# Patient Record
Sex: Female | Born: 1995 | Race: White | Hispanic: No | Marital: Single
Health system: Southern US, Community
[De-identification: ages and names within clinical notes are randomized; demographics above are authoritative.]

---
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
## 2015-09-07 ENCOUNTER — Ambulatory Visit: Payer: Self-pay

## 2015-09-10 ENCOUNTER — Encounter: Payer: Self-pay | Admitting: Rehabilitative and Restorative Service Providers"

## 2015-09-10 ENCOUNTER — Ambulatory Visit: Payer: Medicaid Other | Attending: Family Medicine | Admitting: Rehabilitative and Restorative Service Providers"

## 2015-09-10 DIAGNOSIS — M545 Low back pain, unspecified: Secondary | ICD-10-CM

## 2015-09-10 DIAGNOSIS — R293 Abnormal posture: Secondary | ICD-10-CM | POA: Insufficient documentation

## 2015-09-10 DIAGNOSIS — M546 Pain in thoracic spine: Secondary | ICD-10-CM | POA: Insufficient documentation

## 2015-09-10 NOTE — Patient Instructions (Addendum)
HEP issued: prayer stretch in neutral and diagonals 2x/day 2 reps 30 sec holds; cat/camel x 10 with 5 sec holds 2x/day. Reviewed POC with pt/Mom. No pain with tx. Advised pt/mom that pt may be sore after this visit and she is to perform HEP/heat as indicated for further pain reduction. No pain after tx.

## 2015-09-10 NOTE — Therapy (Signed)
[facility] Outpatient Rehabilitation [facility] [address] [place], [place], [address] Phone: [phone]   Fax:  [phone]  Physical Therapy Evaluation  Patient Details  Name: Claudia Walters MRN: [id number] Date of Birth: 1995/03/21 Referring Provider: Dr. Maurice Small  Encounter Date: 09/10/2015    History reviewed. No pertinent past medical history.  History reviewed. No pertinent surgical history.  There were no vitals filed for this visit.       Subjective Assessment - 09/10/15 1030    Subjective 5-6/10 pain on average; however, pain is intermittent in nature; pt denies radiation.   Pertinent History LBP began last May helping school setup a table when back seized up; she has had intermittent pain since. no diagnostic testing performed   Limitations Standing;Lifting   How long can you sit comfortably? 1.5 hours   How long can you stand comfortably? 45 min   How long can you walk comfortably? 45 min   Diagnostic tests none   Patient Stated Goals to be painfree   Currently in Pain? Yes   Pain Score 8    Pain Location Back   Pain Descriptors / Indicators Cramping   Pain Frequency Intermittent   Aggravating Factors  reaching, lifting, walking, standing   Pain Relieving Factors medicine (Ibuprofen), heat   Effect of Pain on Daily Activities minimal to moderate depending on pain level   Multiple Pain Sites No            [facility] PT Assessment - 09/10/15 0001      Assessment   Medical Diagnosis LBP   Referring Provider Dr. Maurice Small   Onset Date/Surgical Date --  May 2017   Hand Dominance --  right   Next MD Visit --  unknown   Prior Therapy none     Precautions   Precautions None     Restrictions   Weight Bearing Restrictions --  none     Balance Screen   Has the patient fallen in the past 6 months --  no     Home Environment   Living Environment --  lives with mom, goes to college, carries bookbag both shldrs     Prior  Function   Level of Independence --  independent     Cognition   Overall Cognitive Status Within Functional Limits for tasks assessed     Posture/Postural Control   Posture Comments wide shoulders, sacral sitting, slumped, rounded shoulders     ROM / Strength   AROM / PROM / Strength --  thoracolumbar AROM WNL but with bil thoracic psp discomfort      Palpation   Spinal mobility thoracic discomfort with bil shoulder ext   Palpation comment T2-4, T7, L1 hypomobile, L1 pivot point,  bil paraspinals tight and tender L > R                   [facility] Adult PT Treatment/Exercise - 09/10/15 0001      Exercises   Exercises Lumbar     Lumbar Exercises: Quadruped   Other Quadruped Lumbar Exercises prayer stretch x 30 sec each direction x 1 set; cat/camel x 5 with 3-5 sec holds; HEP issued      Manual Therapy   Manual therapy comments T4-7, L1 spinal mobs grade 3 to 2 for pain management and hypomobility                PT Education - 09/10/15 1103    Education provided Yes   Education  Details see pt instructions   Person(s) Educated Patient   Methods Explanation;Demonstration;Handout   Comprehension Returned demonstration;Verbalized understanding          PT Short Term Goals - 09/10/15 1106      PT SHORT TERM GOAL #1   Title pt wil lbe I with HEP   Baseline not I; issued today   Time 3   Period Weeks   Status New     PT SHORT TERM GOAL #2   Title Pt will report 25% reduction in thoacolumbar pain with lifting items   Baseline up to 8/10   Time 3   Period Weeks   Status New     PT SHORT TERM GOAL #3   Title pt will be able to extend shouldes into flexion and behind head without thoracic pain   Baseline 4/10   Time 3   Period Weeks   Status New           PT Long Term Goals - 09/10/15 1108      PT LONG TERM GOAL #1   Title pt will demonstrate improved posture in sitting x 75% of the time without ceuing to peform school work   Baseline  unable; 0%   Time 8   Period Weeks   Status New     PT LONG TERM GOAL #2   Title pt will be able to breathe 100% of the time without thoracolumbar pain   Baseline up to 5/10    Time 8   Period Weeks   Status New     PT LONG TERM GOAL #3   Title pt will report overall thoracolumbar pain </= 2/10 x 2 weeks to perform all functional mobility and school work   Baseline up to 8/10   Time 8   Period Weeks   Status New               Plan - 09/10/15 1104    Clinical Impression Statement Pt presents to PT with several segamants of spinal immobility and resultant paraspinal tightness L > R which is hinderin gpainfree motion and activities. Poor posture   Rehab Potential Good   Clinical Impairments Affecting Rehab Potential none   PT Frequency 2x / week   PT Duration 8 weeks   PT Treatment/Interventions Electrical Stimulation;Moist Heat;Ultrasound;Functional mobility training;Therapeutic activities;Therapeutic exercise;Manual techniques;Patient/family education;Dry needling;Taping   PT Next Visit Plan review HEP, spinal mobs, ultrasound to bil thoracolumbar pasps as indicated with massage   PT Home Exercise Plan prayer stretch, cat/camel   Recommended Other Services none   Consulted and Agree with Plan of Care Patient;Family member/caregiver      Patient will benefit from skilled therapeutic intervention in order to improve the following deficits and impairments:  Decreased mobility, Decreased strength, Hypomobility  Visit Diagnosis: Midline low back pain without sciatica  Pain in thoracic spine  Abnormal posture     Problem List There are no active problems to display for this patient.   Thornell Sartorius , PT 09/10/2015, 11:20 AM  [facility] [address] [place], [place], [address] Phone: [phone]   Fax:  [phone]  Name: LUCEE BRISSETT MRN: [id number] Date of Birth: October 23, 1995

## 2015-09-28 ENCOUNTER — Ambulatory Visit: Payer: Medicaid Other | Admitting: Physical Therapy

## 2015-09-28 DIAGNOSIS — M546 Pain in thoracic spine: Secondary | ICD-10-CM

## 2015-09-28 DIAGNOSIS — M545 Low back pain, unspecified: Secondary | ICD-10-CM

## 2015-09-28 DIAGNOSIS — R293 Abnormal posture: Secondary | ICD-10-CM

## 2015-09-28 NOTE — Therapy (Signed)
[facility] [address] [place], [place], [address] Phone: [phone]   Fax:  [phone]  Physical Therapy Treatment  Patient Details  Name: Claudia Walters MRN: [id number] Date of Birth: 04/24/1995 Referring Provider: Dr. Maurice SmallElaine Griffin  Encounter Date: 09/28/2015      PT End of Session - 09/28/15 1119    Visit Number 2   Number of Visits 16   Date for PT Re-Evaluation 11/05/15   Authorization Type MCD   PT Start Time 1015   PT Stop Time 1108   PT Time Calculation (min) 53 min   Activity Tolerance Patient tolerated treatment well   Behavior During Therapy [facility] for tasks assessed/performed      No past medical history on file.  No past surgical history on file.  There were no vitals filed for this visit.      Subjective Assessment - 09/28/15 1018    Subjective "I am feeling okay today, pain stays in the mid/ low back"    Currently in Pain? Yes   Pain Score 4    Pain Location Back   Pain Orientation Right;Left;Lower;Mid  L >r   Pain Descriptors / Indicators Tightness;Sore   Pain Type Chronic pain   Pain Frequency Intermittent   Aggravating Factors  reaching, lifting, walking, standing,    Pain Relieving Factors ibuprofen, heat                         [facility] Adult PT Treatment/Exercise - 09/28/15 0001      Lumbar Exercises: Stretches   Active Hamstring Stretch 3 reps;30 seconds  PNF contract/ relax with 10 sec hold   Prone Mid Back Stretch 2 reps;30 seconds  childs pose     Modalities   Modalities Moist Heat     Moist Heat Therapy   Number Minutes Moist Heat 10 Minutes   Moist Heat Location Lumbar Spine  pt in prone     Manual Therapy   Manual Therapy Joint mobilization;Myofascial release;Soft tissue mobilization;Muscle Energy Technique   Manual therapy comments manual trigger point release x 3 along R parapsinals and x 4 along L parapsinals   Joint Mobilization grade 3 T11-L5 central  P>A   for pain relief and improve mobility   Soft tissue mobilization IASTM of bil parapsinals    Myofascial Release lumbar paraspinal fascial stretching/ rolling   Muscle Energy Technique prone L innominate grade 3 anterior rotation, with resisted L hip flexion 5 x 10 sec hold                PT Education - 09/28/15 1117    Education provided Yes   Education Details anatomy of the hip in regard to rotation of the hip innominate and effect on the paraspinals using spinal model. Updated HEP for innomiate rotation.    Person(s) Educated Patient   Methods Explanation;Handout;Verbal cues;Demonstration   Comprehension Verbalized understanding;Verbal cues required          PT Short Term Goals - 09/28/15 1123      PT SHORT TERM GOAL #1   Title pt wil lbe I with HEP   Baseline not I; issued today   Time 3   Period Weeks   Status On-going     PT SHORT TERM GOAL #2   Title Pt will report 25% reduction in thoacolumbar pain with lifting items   Baseline up to 8/10   Time 3   Period Weeks   Status  On-going     PT SHORT TERM GOAL #3   Title pt will be able to extend shouldes into flexion and behind head without thoracic pain   Baseline 4/10   Time 3   Period Weeks   Status On-going           PT Long Term Goals - 09/10/15 1108      PT LONG TERM GOAL #1   Title pt will demonstrate improved posture in sitting x 75% of the time without ceuing to peform school work   Baseline unable; 0%   Time 8   Period Weeks   Status New     PT LONG TERM GOAL #2   Title pt will be able to breathe 100% of the time without thoracolumbar pain   Baseline up to 5/10    Time 8   Period Weeks   Status New     PT LONG TERM GOAL #3   Title pt will report overall thoracolumbar pain </= 2/10 x 2 weeks to perform all functional mobility and school work   Baseline up to 8/10   Time 8   Period Weeks   Status New               Plan - 09/28/15 1120    Clinical Impression  Statement [name] initally reported 4/10 pain today. 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 Following METs and Mobs and soft tissue work on the low back she reported no pain with bending walking or standing.    PT Next Visit Plan spinal mobs, DN to paraspinals if pt comfortable, manual, core strengthening, anterior innominate deficit treatment on L   PT Home Exercise Plan Hamstring stretching, hip flexor strengthening.    Consulted and Agree with Plan of Care Patient      Patient will benefit from skilled therapeutic intervention in order to improve the following deficits and impairments:     Visit Diagnosis: Midline low back pain without sciatica  Pain in thoracic spine  Abnormal posture     Problem List There are no active problems to display for this patient.  Lulu RidingKristoffer Oday Ridings PT, DPT, LAT, ATC  09/28/15  11:24 AM      [facility] [address] [place], [place], [address] Phone: [phone]   Fax:  [phone]  Name: Claudia Walters MRN: [id number] Date of Birth: 06/17/1995

## 2015-09-30 ENCOUNTER — Ambulatory Visit: Payer: Medicaid Other | Admitting: Physical Therapy

## 2015-09-30 DIAGNOSIS — R293 Abnormal posture: Secondary | ICD-10-CM

## 2015-09-30 DIAGNOSIS — M545 Low back pain, unspecified: Secondary | ICD-10-CM

## 2015-09-30 DIAGNOSIS — M546 Pain in thoracic spine: Secondary | ICD-10-CM

## 2015-09-30 NOTE — Therapy (Signed)
[facility] [address] [place], [place], [address] Phone: [phone]   Fax:  [phone]  Physical Therapy Treatment  Patient Details  Name: Claudia Walters MRN: [id number] Date of Birth: 05/13/1995 Referring Provider: Dr. Maurice SmallElaine Griffin  Encounter Date: 09/30/2015      PT End of Session - 09/30/15 1147    Visit Number 3   Number of Visits 16   Date for PT Re-Evaluation 11/05/15   Authorization Type MCD   PT Start Time 1015   PT Stop Time 1103   PT Time Calculation (min) 48 min   Activity Tolerance Patient tolerated treatment well   Behavior During Therapy [facility] for tasks assessed/performed      No past medical history on file.  No past surgical history on file.  There were no vitals filed for this visit.      Subjective Assessment - 09/30/15 1021    Subjective "last session felt pretty good, today I am having some soreness    Currently in Pain? Yes   Pain Score 4    Pain Location Back   Pain Orientation Left   Pain Descriptors / Indicators Sore   Pain Type Chronic pain   Pain Onset More than a month ago   Pain Frequency Intermittent                         [facility] Adult PT Treatment/Exercise - 09/30/15 1034      Lumbar Exercises: Aerobic   Stationary Bike Nu-Step L5 x 7 min  UE/ LE     Lumbar Exercises: Supine   Bridge 10 reps;Other (comment)   Bridge Limitations with alt leg extension keeping core tight, yard stick across pelvis for cues of tilting  verbal cues to keep core tight throuhout exercise   Other Supine Lumbar Exercises dead bug 3 x 15 sec     Moist Heat Therapy   Number Minutes Moist Heat 10 Minutes   Moist Heat Location Lumbar Spine     Manual Therapy   Manual therapy comments manual trigger point release x 3 along R parapsinals and x 4 along L parapsinals   Joint Mobilization grade 3 T11-L5 central P>A    Soft tissue mobilization IASTM of bil parapsinals    Myofascial  Release lumbar paraspinal fascial stretching/ rolling   Muscle Energy Technique prone L innominate grade 3 anterior rotation, with resisted L hip flexion 5 x 10 sec hold                  PT Short Term Goals - 09/28/15 1123      PT SHORT TERM GOAL #1   Title pt wil lbe I with HEP   Baseline not I; issued today   Time 3   Period Weeks   Status On-going     PT SHORT TERM GOAL #2   Title Pt will report 25% reduction in thoacolumbar pain with lifting items   Baseline up to 8/10   Time 3   Period Weeks   Status On-going     PT SHORT TERM GOAL #3   Title pt will be able to extend shouldes into flexion and behind head without thoracic pain   Baseline 4/10   Time 3   Period Weeks   Status On-going           PT Long Term Goals - 09/10/15 1108      PT LONG TERM GOAL #1  Title pt will demonstrate improved posture in sitting x 75% of the time without ceuing to peform school work   Baseline unable; 0%   Time 8   Period Weeks   Status New     PT LONG TERM GOAL #2   Title pt will be able to breathe 100% of the time without thoracolumbar pain   Baseline up to 5/10    Time 8   Period Weeks   Status New     PT LONG TERM GOAL #3   Title pt will report overall thoracolumbar pain </= 2/10 x 2 weeks to perform all functional mobility and school work   Baseline up to 8/10   Time 8   Period Weeks   Status New               Plan - 09/30/15 1148    Clinical Impression Statement [name] report pain 4/10 but states it may be due to laying flat. Focused on core strengthening and  manual to calm down soreness in the low back which whe reported relief of pain to 0/10 today.    PT Next Visit Plan spinal mobs, DN to paraspinals if pt comfortable, manual, core strengthening, anterior innominate deficit treatment on L   Consulted and Agree with Plan of Care Patient      Patient will benefit from skilled therapeutic intervention in order to improve the following deficits  and impairments:  Decreased mobility, Decreased strength, Hypomobility  Visit Diagnosis: Midline low back pain without sciatica  Pain in thoracic spine  Abnormal posture     Problem List There are no active problems to display for this patient.  Lulu RidingKristoffer Leamon PT, DPT, LAT, ATC  09/30/15  11:54 AM      [facility] [address] [place], [place], [address] Phone: [phone]   Fax:  [phone]  Name: Claudia Walters MRN: [id number] Date of Birth: 11/18/1995

## 2015-10-05 ENCOUNTER — Ambulatory Visit: Payer: Medicaid Other | Admitting: Physical Therapy

## 2015-10-05 DIAGNOSIS — R293 Abnormal posture: Secondary | ICD-10-CM

## 2015-10-05 DIAGNOSIS — M546 Pain in thoracic spine: Secondary | ICD-10-CM

## 2015-10-05 DIAGNOSIS — M545 Low back pain, unspecified: Secondary | ICD-10-CM

## 2015-10-05 NOTE — Therapy (Signed)
[facility] Outpatient Rehabilitation [facility] [address] [place], [place], [address] Phone: [phone]   Fax:  [phone]  Physical Therapy Treatment  Patient Details  Name: Claudia Walters MRN: [id number] Date of Birth: 1995-03-30 Referring Provider: Dr. Maurice Small  Encounter Date: 10/05/2015      PT End of Session - 10/05/15 1103    Visit Number 4   Number of Visits 16   Date for PT Re-Evaluation 11/05/15   Authorization Type MCD   PT Start Time 1015   PT Stop Time 1059   PT Time Calculation (min) 44 min   Activity Tolerance Patient tolerated treatment well   Behavior During Therapy [facility] for tasks assessed/performed      No past medical history on file.  No past surgical history on file.  There were no vitals filed for this visit.      Subjective Assessment - 10/05/15 1020    Subjective "I am doing okay today no pain just tired today"    Currently in Pain? No/denies                         [facility] Adult PT Treatment/Exercise - 10/05/15 1023      Lumbar Exercises: Stretches   Prone Mid Back Stretch 4 reps;30 seconds  2 x ea to R/L      Lumbar Exercises: Aerobic   Stationary Bike Nu-Step L6 x 8 min  UE/LE     Lumbar Exercises: Seated   Hip Flexion on Ball AROM;Strengthening  lift/ chop seated on physioball 2 x 10 ea. to L/R   Hip Flexion on Ball Limitations red theraband     Lumbar Exercises: Supine   Bridge 10 reps;Other (comment)  2 sets   Bridge Limitations with alt leg extension keeping core tight, yard stick across pelvis for cues of tilting  yard stick across weight for cues for form   Other Supine Lumbar Exercises dead bug 3 x 30 sec   Other Supine Lumbar Exercises supine marching 2 x 10 lying parallel on bolster  signficiant difficulty maintaining balance with R hip flex     Lumbar Exercises: Quadruped   Opposite Arm/Leg Raise 10 reps;Right arm/Left leg;Left arm/Right leg  verbal/tactile cues for for form                 PT Education - 10/05/15 1102    Education provided Yes   Education Details updated HEP for core strengthening with proper form and treatment rationale.    Person(s) Educated Patient   Methods Explanation;Demonstration;Verbal cues;Handout;Tactile cues   Comprehension Verbalized understanding;Verbal cues required          PT Short Term Goals - 09/28/15 1123      PT SHORT TERM GOAL #1   Title pt wil lbe I with HEP   Baseline not I; issued today   Time 3   Period Weeks   Status On-going     PT SHORT TERM GOAL #2   Title Pt will report 25% reduction in thoacolumbar pain with lifting items   Baseline up to 8/10   Time 3   Period Weeks   Status On-going     PT SHORT TERM GOAL #3   Title pt will be able to extend shouldes into flexion and behind head without thoracic pain   Baseline 4/10   Time 3   Period Weeks   Status On-going           PT  Long Term Goals - 09/10/15 1108      PT LONG TERM GOAL #1   Title pt will demonstrate improved posture in sitting x 75% of the time without ceuing to peform school work   Baseline unable; 0%   Time 8   Period Weeks   Status New     PT LONG TERM GOAL #2   Title pt will be able to breathe 100% of the time without thoracolumbar pain   Baseline up to 5/10    Time 8   Period Weeks   Status New     PT LONG TERM GOAL #3   Title pt will report overall thoracolumbar pain </= 2/10 x 2 weeks to perform all functional mobility and school work   Baseline up to 8/10   Time 8   Period Weeks   Status New               Plan - 10/05/15 1103    Clinical Impression Statement Claudia Walters states she has no pain today. Focused session on core strengthening which she performed well requiring verbal cues for proper form. pt declined modalities post session and reported 0/10 pain today.    PT Next Visit Plan spinal mobs, DN to paraspinals if pt comfortable, manual, core strengthening,    PT Home Exercise Plan dead  bug, bridges with alternating leg extension   Consulted and Agree with Plan of Care Patient      Patient will benefit from skilled therapeutic intervention in order to improve the following deficits and impairments:  Decreased mobility, Decreased strength, Hypomobility  Visit Diagnosis: Midline low back pain without sciatica  Pain in thoracic spine  Abnormal posture     Problem List There are no active problems to display for this patient.  [name] PT, DPT, LAT, ATC  10/05/15  11:06 AM      [facility] [address] [place], [place], [address] Phone: [phone]   Fax:  [phone]  Name: Claudia Walters MRN: [id number] Date of Birth: 11/08/1995

## 2015-10-07 ENCOUNTER — Ambulatory Visit: Payer: Medicaid Other | Admitting: Physical Therapy

## 2015-10-07 DIAGNOSIS — M545 Low back pain, unspecified: Secondary | ICD-10-CM

## 2015-10-07 DIAGNOSIS — R293 Abnormal posture: Secondary | ICD-10-CM

## 2015-10-07 DIAGNOSIS — M546 Pain in thoracic spine: Secondary | ICD-10-CM

## 2015-10-07 NOTE — Therapy (Signed)
[facility] [address] [place], [place], [address] Phone: [phone]   Fax:  [phone]  Physical Therapy Treatment  Patient Details  Name: Claudia Walters MRN: [id number] Date of Birth: 06/28/1995 Referring Provider: Dr. Maurice SmallElaine Griffin  Encounter Date: 10/07/2015      PT End of Session - 10/07/15 1059    Visit Number 5   Number of Visits 16   Date for PT Re-Evaluation 11/05/15   Authorization Type MCD   PT Start Time 1015   PT Stop Time 1105   PT Time Calculation (min) 50 min   Activity Tolerance Patient tolerated treatment well   Behavior During Therapy [facility] for tasks assessed/performed      No past medical history on file.  No past surgical history on file.  There were no vitals filed for this visit.      Subjective Assessment - 10/07/15 1019    Subjective "I am feeling alittle sore in my back today but is probably due to sitting on the floor at my mom's office"    Currently in Pain? Yes   Pain Score 4    Pain Location Back   Pain Orientation Left   Pain Descriptors / Indicators Aching;Sore   Pain Type Chronic pain   Pain Onset More than a month ago   Pain Frequency Intermittent   Aggravating Factors  prolonged sitting, walking/ standing   Pain Relieving Factors Ibuprofen, heat                         [facility] Adult PT Treatment/Exercise - 10/07/15 1026      Lumbar Exercises: Stretches   Prone Mid Back Stretch 4 reps;30 seconds  2 x to the R, 2 x to the L     Lumbar Exercises: Aerobic   Stationary Bike Nu-Step L6 x 8 min     Lumbar Exercises: Seated   Hip Flexion on Ball AROM;Strengthening  lift/ chop,  and pelvic tilts 2 x 10 holding 5 sec each   Hip Flexion on Ball Limitations red theraband     Lumbar Exercises: Quadruped   Madcat/Old Horse 10 reps     Moist Heat Therapy   Number Minutes Moist Heat 10 Minutes   Moist Heat Location Lumbar Spine  prone      Manual Therapy   Manual therapy comments manual trigger point release x 3 along R parapsinals and x 4 along L parapsinals                PT Education - 10/07/15 1058    Education provided Yes   Education Details updated and reviewed HEP, posture in regard to lumbar curves and HEP to help with lumbar curve in sitting.   Person(s) Educated Patient   Methods Explanation;Verbal cues;Handout;Demonstration   Comprehension Verbalized understanding;Verbal cues required;Returned demonstration          PT Short Term Goals - 09/28/15 1123      PT SHORT TERM GOAL #1   Title pt wil lbe I with HEP   Baseline not I; issued today   Time 3   Period Weeks   Status On-going     PT SHORT TERM GOAL #2   Title Pt will report 25% reduction in thoacolumbar pain with lifting items   Baseline up to 8/10   Time 3   Period Weeks   Status On-going     PT SHORT TERM GOAL #3   Title pt  will be able to extend shouldes into flexion and behind head without thoracic pain   Baseline 4/10   Time 3   Period Weeks   Status On-going           PT Long Term Goals - 09/10/15 1108      PT LONG TERM GOAL #1   Title pt will demonstrate improved posture in sitting x 75% of the time without ceuing to peform school work   Baseline unable; 0%   Time 8   Period Weeks   Status New     PT LONG TERM GOAL #2   Title pt will be able to breathe 100% of the time without thoracolumbar pain   Baseline up to 5/10    Time 8   Period Weeks   Status New     PT LONG TERM GOAL #3   Title pt will report overall thoracolumbar pain </= 2/10 x 2 weeks to perform all functional mobility and school work   Baseline up to 8/10   Time 8   Period Weeks   Status New               Plan - 10/07/15 1059    Clinical Impression Statement Claudia Walters reports increased soreness today compared to last session due to possibly sitting unsupported for long period of time. educated about posture in sitting. following stretching and soff  tissue work she report 0/10 pain. she was able to peform exercises well requiring verbal cues for form. continues MHP post session for mild soreness.   PT Next Visit Plan spinal mobs, DN to paraspinals if pt comfortable, manual, core strengthening, progress HEP PRN   PT Home Exercise Plan seated pelvic tilt   Consulted and Agree with Plan of Care Patient      Patient will benefit from skilled therapeutic intervention in order to improve the following deficits and impairments:  Decreased mobility, Decreased strength, Hypomobility  Visit Diagnosis: Midline low back pain without sciatica  Pain in thoracic spine  Abnormal posture     Problem List There are no active problems to display for this patient.  Claudia Walters PT, DPT, LAT, ATC  10/07/15  11:03 AM      [facility] [address] [place], [place], [address] Phone: [phone]   Fax:  [phone]  Name: Claudia Walters MRN: [id number] Date of Birth: [birth year]

## 2015-10-13 ENCOUNTER — Ambulatory Visit: Payer: Self-pay | Attending: Family Medicine | Admitting: Physical Therapy

## 2015-10-13 DIAGNOSIS — R293 Abnormal posture: Secondary | ICD-10-CM | POA: Insufficient documentation

## 2015-10-13 DIAGNOSIS — M545 Low back pain, unspecified: Secondary | ICD-10-CM

## 2015-10-13 DIAGNOSIS — M546 Pain in thoracic spine: Secondary | ICD-10-CM | POA: Insufficient documentation

## 2015-10-13 NOTE — Therapy (Signed)
[facility] [address] [place], [place], [address] Phone: [phone]   Fax:  [phone]  Physical Therapy Treatment  Patient Details  Name: Claudia DillRachel L Walters MRN: [id number] Date of Birth: 07/12/1995 Referring Provider: Dr. Maurice SmallElaine Griffin  Encounter Date: 10/13/2015      PT End of Session - 10/13/15 1055    Visit Number 6   Number of Visits 16   Date for PT Re-Evaluation 11/05/15   Authorization Type MCD   PT Start Time 1017   PT Stop Time 1059   PT Time Calculation (min) 42 min   Activity Tolerance Patient tolerated treatment well   Behavior During Therapy [facility] for tasks assessed/performed      No past medical history on file.  No past surgical history on file.  There were no vitals filed for this visit.      Subjective Assessment - 10/13/15 1014    Subjective "The back feels good today"   Currently in Pain? No/denies                         [facility] Treatment/Exercise - 10/13/15 1018      Lumbar Exercises: Stretches   Prone Mid Back Stretch 2 reps;30 seconds  1x to the R, 1 x to the L     Lumbar Exercises: Aerobic   Elliptical L 5 x 0 elevation, 5 min forward and 5 min backward.       Lumbar Exercises: Machines for Strengthening   Other Lumbar Machine Exercise freemotion chest press/ row unilatera 1x 10 bil   verbal cues to avoid rotation keep core tight     Lumbar Exercises: Seated   Hip Flexion on Ball --  1/2 kneeling on airex pad 2 x bil with 10 head turns     Lumbar Exercises: Supine   Other Supine Lumbar Exercises supine marching 2 x 15 lying parallel on bolster  with arms toward ceiling, tactile cues for R LE     Lumbar Exercises: Quadruped   Opposite Arm/Leg Raise 10 reps;Right arm/Left leg;Left arm/Right leg  2 sets, with yard stick across back for form                PT Education - 10/13/15 1310    Education provided Yes   Education Details Reviewed HEP and  updated for continued core strengthening   Person(s) Educated Patient   Methods Explanation;Verbal cues;Handout   Comprehension Verbalized understanding;Verbal cues required          PT Short Term Goals - 09/28/15 1123      PT SHORT TERM GOAL #1   Title pt wil lbe I with HEP   Baseline not I; issued today   Time 3   Period Weeks   Status On-going     PT SHORT TERM GOAL #2   Title Pt will report 25% reduction in thoacolumbar pain with lifting items   Baseline up to 8/10   Time 3   Period Weeks   Status On-going     PT SHORT TERM GOAL #3   Title pt will be able to extend shouldes into flexion and behind head without thoracic pain   Baseline 4/10   Time 3   Period Weeks   Status On-going           PT Long Term Goals - 09/10/15 1108      PT LONG TERM GOAL #1   Title pt will  demonstrate improved posture in sitting x 75% of the time without ceuing to peform school work   Baseline unable; 0%   Time 8   Period Weeks   Status New     PT LONG TERM GOAL #2   Title pt will be able to breathe 100% of the time without thoracolumbar pain   Baseline up to 5/10    Time 8   Period Weeks   Status New     PT LONG TERM GOAL #3   Title pt will report overall thoracolumbar pain </= 2/10 x 2 weeks to perform all functional mobility and school work   Baseline up to 8/10   Time 8   Period Weeks   Status New               Plan - 10/13/15 1311    Clinical Impression Statement Claudia Walters continues to progress with treatment reported no pain initially today. continued core strengthening progressing into standing and including UE which she performed well reporting only some burning in th eback that dissipated following exericses. pt declined MHP post session.    PT Next Visit Plan spinal mobs, DN to paraspinals if pt comfortable, manual, core strengthening, progress HEP PRN, AROM, drop to 1 x a week for few weeks.    PT Home Exercise Plan bird dogs.    Consulted and Agree with  Plan of Care Patient      Patient will benefit from skilled therapeutic intervention in order to improve the following deficits and impairments:     Visit Diagnosis: Midline low back pain without sciatica  Pain in thoracic spine  Abnormal posture     Problem List There are no active problems to display for this patient.  Claudia Walters PT, DPT, LAT, ATC  10/13/15  1:14 PM      [facility] [address] [place], [place], [address] Phone: [phone]   Fax:  [phone]  Name: Claudia Walters MRN: [id number] Date of Birth: Nov 14, 1995

## 2015-10-15 ENCOUNTER — Ambulatory Visit: Payer: Self-pay | Admitting: Physical Therapy

## 2015-10-15 DIAGNOSIS — M545 Low back pain, unspecified: Secondary | ICD-10-CM

## 2015-10-15 DIAGNOSIS — R293 Abnormal posture: Secondary | ICD-10-CM

## 2015-10-15 DIAGNOSIS — M546 Pain in thoracic spine: Secondary | ICD-10-CM

## 2015-10-15 NOTE — Therapy (Signed)
[facility] [address] [place], [place], [address] Phone: [phone]   Fax:  [phone]  Physical Therapy Treatment  Patient Details  Name: Claudia Walters MRN: [id number] Date of Birth: 21-Feb-1995 Referring Provider: Dr. Maurice Small  Encounter Date: 10/15/2015      PT End of Session - 10/15/15 1013    Visit Number 7   Number of Visits 16   Date for PT Re-Evaluation 11/05/15   Authorization Type MCD   PT Start Time 1015   PT Stop Time 1108   PT Time Calculation (min) 53 min   Activity Tolerance Patient tolerated treatment well   Behavior During Therapy [facility] for tasks assessed/performed      No past medical history on file.  No past surgical history on file.  There were no vitals filed for this visit.      Subjective Assessment - 10/15/15 1014    Subjective "I am alittle sore today, alittle better than it was"    Currently in Pain? Yes   Pain Score 2    Pain Location Back   Pain Orientation Left   Pain Descriptors / Indicators Sore   Pain Frequency Intermittent   Aggravating Factors  prolonged standing,    Pain Relieving Factors Heat, stretching                         [facility] Adult PT Treatment/Exercise - 10/15/15 0001      Lumbar Exercises: Prone   Other Prone Lumbar Exercises sidelying book opening 2 x 10 bil      Moist Heat Therapy   Number Minutes Moist Heat 10 Minutes   Moist Heat Location Lumbar Spine     Manual Therapy   Joint Mobilization grade 3 T11-L5 central P>A , Unilateral on L L1 to facilitate rotation grade 3 oscillations   Soft tissue mobilization IASTM of bil parapsinals    Myofascial Release lumbar paraspinal fascial stretching/ rolling          Trigger Point Dry Needling - 10/15/15 1019    Consent Given? Yes   Education Handout Provided Yes              PT Education - 10/15/15 1100    Education provided Yes   Education Details anatomy of muscles in  regard to formation of trigger points, benefits of TPDN and what to expect and after care. updated HEP   Person(s) Educated Patient   Methods Explanation;Verbal cues;Handout   Comprehension Verbalized understanding;Verbal cues required          PT Short Term Goals - 09/28/15 1123      PT SHORT TERM GOAL #1   Title pt wil lbe I with HEP   Baseline not I; issued today   Time 3   Period Weeks   Status On-going     PT SHORT TERM GOAL #2   Title Pt will report 25% reduction in thoacolumbar pain with lifting items   Baseline up to 8/10   Time 3   Period Weeks   Status On-going     PT SHORT TERM GOAL #3   Title pt will be able to extend shouldes into flexion and behind head without thoracic pain   Baseline 4/10   Time 3   Period Weeks   Status On-going           PT Long Term Goals - 09/10/15 1108      PT LONG TERM  GOAL #1   Title pt will demonstrate improved posture in sitting x 75% of the time without ceuing to peform school work   Baseline unable; 0%   Time 8   Period Weeks   Status New     PT LONG TERM GOAL #2   Title pt will be able to breathe 100% of the time without thoracolumbar pain   Baseline up to 5/10    Time 8   Period Weeks   Status New     PT LONG TERM GOAL #3   Title pt will report overall thoracolumbar pain </= 2/10 x 2 weeks to perform all functional mobility and school work   Baseline up to 8/10   Time 8   Period Weeks   Status New               Plan - 10/15/15 1101    Clinical Impression Statement Claudia Walters reported mild soreness today. TPDN was performed on R multifidus, following Soft tissue work focused on mobs on L1 T12 segment and thoracic mobility with book opening which she reported decreased pain and improved mobility post treatment. MHP post session for DN soreness. plan to drop to 1x week for the next 4 weeks.    PT Next Visit Plan spinal mobs, DN to paraspinals if pt comfortable, manual, core strengthening, progress HEP PRN,  AROM, GOAL    PT Home Exercise Plan book opening exericses.    Consulted and Agree with Plan of Care Patient      Patient will benefit from skilled therapeutic intervention in order to improve the following deficits and impairments:  Decreased mobility, Decreased strength, Hypomobility  Visit Diagnosis: Midline low back pain without sciatica  Pain in thoracic spine  Abnormal posture     Problem List There are no active problems to display for this patient.  Lulu RidingKristoffer Tyliah Schlereth PT, DPT, LAT, ATC  10/15/15  11:04 AM      [facility] [address] [place], [place], [address] Phone: [phone]   Fax:  [phone]  Name: Claudia Walters MRN: [id number] Date of Birth: 05/30/1995

## 2015-10-23 ENCOUNTER — Ambulatory Visit: Payer: Self-pay | Admitting: Physical Therapy

## 2015-10-23 DIAGNOSIS — M545 Low back pain, unspecified: Secondary | ICD-10-CM

## 2015-10-23 DIAGNOSIS — M546 Pain in thoracic spine: Secondary | ICD-10-CM

## 2015-10-23 DIAGNOSIS — R293 Abnormal posture: Secondary | ICD-10-CM

## 2015-10-23 NOTE — Therapy (Signed)
[facility] [address] [place], [place], [address] Phone: [phone]   Fax:  [phone]  Physical Therapy Treatment  Patient Details  Name: Claudia Walters MRN: [id number] Date of Birth: 1995/12/08 Referring Provider: Dr. Maurice Small  Encounter Date: 10/23/2015    No past medical history on file.  No past surgical history on file.  There were no vitals filed for this visit.      Subjective Assessment - 10/23/15 1028    Subjective "I am doing really well today, some soreness with standing/ walking for long periods [place] the [place] but more soreness not pain"    Currently in Pain? No/denies                         [facility] Adult PT Treatment/Exercise - 10/23/15 0001      Lumbar Exercises: Stretches   Prone Mid Back Stretch 2 reps;30 seconds  childs pose     Lumbar Exercises: Aerobic   Stationary Bike L 6 x 8 min  UE/LE with increased speed     Lumbar Exercises: Machines for Strengthening   Other Lumbar Machine Exercise feemotion lift/chop 2 x 10 each performed bil 7#  verbal/ tactile cues for form     Lumbar Exercises: Supine   Other Supine Lumbar Exercises supine foam roll routine with bil ceiling punches, alternating ceiling punches, horizontal abd/adduciton, alternating shoulder flexion/ extenion   Other Supine Lumbar Exercises supine marching 2 x 15 lying parallel on bolster     Lumbar Exercises: Prone   Other Prone Lumbar Exercises sidelying book opening 1 x 15 bil      Lumbar Exercises: Quadruped   Opposite Arm/Leg Raise Right arm/Left leg;Left arm/Right leg;10 reps;2 seconds  x 2 sets, i set with elbow/ knee crunch   Plank 4 x 10 sec hold  tactile cues for form to reduce L trunk rotation                  PT Short Term Goals - 09/28/15 1123      PT SHORT TERM GOAL #1   Title pt wil lbe I with HEP   Baseline not I; issued today   Time 3   Period Weeks   Status  On-going     PT SHORT TERM GOAL #2   Title Pt will report 25% reduction in thoacolumbar pain with lifting items   Baseline up to 8/10   Time 3   Period Weeks   Status On-going     PT SHORT TERM GOAL #3   Title pt will be able to extend shouldes into flexion and behind head without thoracic pain   Baseline 4/10   Time 3   Period Weeks   Status On-going           PT Long Term Goals - 09/10/15 1108      PT LONG TERM GOAL #1   Title pt will demonstrate improved posture in sitting x 75% of the time without ceuing to peform school work   Baseline unable; 0%   Time 8   Period Weeks   Status New     PT LONG TERM GOAL #2   Title pt will be able to breathe 100% of the time without thoracolumbar pain   Baseline up to 5/10    Time 8   Period Weeks   Status New     PT LONG TERM GOAL #3   Title  pt will report overall thoracolumbar pain </= 2/10 x 2 weeks to perform all functional mobility and school work   Baseline up to 8/10   Time 8   Period Weeks   Status New               Plan - 10/23/15 1115    Clinical Impression Statement Claudia Walters states the TPDN helped last session but continues to be nervous about it and opted to not have it done today but was also having no pain. focused on core strengthing in multiple postions which she performed well with report of exericse soreness but no pain. ot declined modalities post session.    PT Next Visit Plan spinal mobs, DN to paraspinals if pt comfortable, manual, core strengthening, progress HEP PRN, AROM, GOAL       Patient will benefit from skilled therapeutic intervention in order to improve the following deficits and impairments:  Decreased mobility, Decreased strength, Hypomobility  Visit Diagnosis: Midline low back pain without sciatica  Pain in thoracic spine  Abnormal posture     Problem List There are no active problems to display for this patient.  Claudia Walters PT, DPT, LAT, ATC  10/23/15  11:20  AM      [facility] [address] [place], [place], [address] Phone: [phone]   Fax:  [phone]  Name: Claudia Walters MRN: [id number] Date of Birth: 03/06/1995

## 2015-10-30 ENCOUNTER — Ambulatory Visit: Payer: Medicaid Other | Admitting: Physical Therapy

## 2015-10-30 DIAGNOSIS — M545 Low back pain, unspecified: Secondary | ICD-10-CM

## 2015-10-30 DIAGNOSIS — M546 Pain in thoracic spine: Secondary | ICD-10-CM

## 2015-10-30 DIAGNOSIS — R293 Abnormal posture: Secondary | ICD-10-CM

## 2015-10-30 NOTE — Therapy (Signed)
[facility] [address] [place], [place], [address] Phone: [phone]   Fax:  [phone]  Physical Therapy Treatment  Patient Details  Name: Claudia DillRachel L Walters MRN: [id number] Date of Birth: 02/21/1995 Referring Provider: Dr. Maurice SmallElaine Griffin  Encounter Date: 10/30/2015      PT End of Session - 10/30/15 1232    Visit Number 8   Number of Visits 16   Date for PT Re-Evaluation 11/05/15   Authorization Type MCD   PT Start Time 1015   PT Stop Time 1059   PT Time Calculation (min) 44 min   Activity Tolerance Patient tolerated treatment well   Behavior During Therapy [facility] for tasks assessed/performed      No past medical history on file.  No past surgical history on file.  There were no vitals filed for this visit.      Subjective Assessment - 10/30/15 1021    Subjective " I am feeling alittle sore today but mostly because I was sick on Wednesday throwing up"   Currently in Pain? Yes   Pain Score 1    Pain Location Back   Pain Orientation Left   Pain Descriptors / Indicators Sore   Pain Type Chronic pain   Pain Onset More than a month ago   Pain Frequency Intermittent   Aggravating Factors  prolong sitting or standing   Pain Relieving Factors heating stretching                         [facility] Adult PT Treatment/Exercise - 10/30/15 1023      Lumbar Exercises: Stretches   Lower Trunk Rotation 30 seconds;2 reps   Prone Mid Back Stretch 4 reps;30 seconds  holding on end of table     Lumbar Exercises: Aerobic   Elliptical L 5 x 10 min  forwad 5 min, backward 5 min     Lumbar Exercises: Seated   Hip Flexion on Ball AROM;Strengthening   Hip Flexion on Ball Limitations starting with legs 2 x 10 (vebral/ tactile cues to keep low back in neutral, 2 x 10 adding opposite arm lifint  performed while sitting on physioball     Lumbar Exercises: Quadruped   Madcat/Old Horse 10 reps  to find neutral    Other  Quadruped Lumbar Exercises following madcat/ horse exericse in nuetral sping rocking back and forth maintaing neutral spine 2 x 10; multiple verbal / tactile cues for form  facilitate back/ hip disociation                PT Education - 10/30/15 1237    Education provided Yes   Education Details when peroforming cat/cow exercises work on finding neutral position and maintain that position while rocking back. Stop activity as soon as she loses neutral spine.    Person(s) Educated Patient   Methods Explanation;Verbal cues;Demonstration   Comprehension Verbalized understanding;Verbal cues required;Returned demonstration;Tactile cues required          PT Short Term Goals - 09/28/15 1123      PT SHORT TERM GOAL #1   Title pt wil lbe I with HEP   Baseline not I; issued today   Time 3   Period Weeks   Status On-going     PT SHORT TERM GOAL #2   Title Pt will report 25% reduction in thoacolumbar pain with lifting items   Baseline up to 8/10   Time 3   Period Weeks   Status  On-going     PT SHORT TERM GOAL #3   Title pt will be able to extend shouldes into flexion and behind head without thoracic pain   Baseline 4/10   Time 3   Period Weeks   Status On-going           PT Long Term Goals - 09/10/15 1108      PT LONG TERM GOAL #1   Title pt will demonstrate improved posture in sitting x 75% of the time without ceuing to peform school work   Baseline unable; 0%   Time 8   Period Weeks   Status New     PT LONG TERM GOAL #2   Title pt will be able to breathe 100% of the time without thoracolumbar pain   Baseline up to 5/10    Time 8   Period Weeks   Status New     PT LONG TERM GOAL #3   Title pt will report overall thoracolumbar pain </= 2/10 x 2 weeks to perform all functional mobility and school work   Baseline up to 8/10   Time 8   Period Weeks   Status New               Plan - 10/30/15 1232    Clinical Impression Statement Claudia Walters reported  soreness due to feeling sick and vomiting on Wednesday contributing to muscle soreness. Focused on back and hip disociation exerices; pt required mulitple verbal and tactile cues for form in both quadruped and sitting. pt declined modalities post session.    PT Next Visit Plan spinal mobs, DN to paraspinals if pt comfortable, manual, core strengthening, progress HEP PRN, AROM, GOAL, ERO   Consulted and Agree with Plan of Care Patient      Patient will benefit from skilled therapeutic intervention in order to improve the following deficits and impairments:  Decreased mobility, Decreased strength, Hypomobility  Visit Diagnosis: Midline low back pain without sciatica  Pain in thoracic spine  Abnormal posture     Problem List There are no active problems to display for this patient.  Lulu Riding PT, DPT, LAT, ATC  10/30/15  12:42 PM      [facility] [address] [place], [place], [address] Phone: [phone]   Fax:  [phone]  Name: Claudia Walters MRN: [id number] Date of Birth: 1995/03/28

## 2015-11-06 ENCOUNTER — Ambulatory Visit: Payer: Medicaid Other | Admitting: Physical Therapy

## 2015-11-06 DIAGNOSIS — M545 Low back pain, unspecified: Secondary | ICD-10-CM

## 2015-11-06 DIAGNOSIS — R293 Abnormal posture: Secondary | ICD-10-CM

## 2015-11-06 DIAGNOSIS — M546 Pain in thoracic spine: Secondary | ICD-10-CM

## 2015-11-06 NOTE — Therapy (Signed)
[facility] [address] [place], [place], [address] Phone: [phone]   Fax:  [phone]  Physical Therapy Treatment / Re-certification  Patient Details  Name: Claudia Walters MRN: [id number] Date of Birth: [birth year] Referring Provider: Dr. Maurice Small  Encounter Date: 11/06/2015      PT End of Session - 11/06/15 1102    Visit Number 9   Number of Visits 16   Date for PT Re-Evaluation 11/20/15   PT Start Time 1017   PT Stop Time 1102   PT Time Calculation (min) 45 min   Activity Tolerance Patient tolerated treatment well   Behavior During Therapy [facility] for tasks assessed/performed      No past medical history on file.  No past surgical history on file.  There were no vitals filed for this visit.      Subjective Assessment - 11/06/15 1026    Subjective "I have been doing well, did have some soreness sitting at moms office but did some pelvic tilts and it helped"   Currently in Pain? No/denies            [facility] PT Assessment - 11/06/15 1033      Observation/Other Assessments   Focus on Therapeutic Outcomes (FOTO)  28% limited                     [facility] Adult PT Treatment/Exercise - 11/06/15 0001      Lumbar Exercises: Standing   Other Standing Lumbar Exercises self manual trigger point release against the wall 3 x 60 sec hold, and 3 x using theracane      Lumbar Exercises: Quadruped   Opposite Arm/Leg Raise Right arm/Left leg;Left arm/Right leg;10 reps;2 seconds   Plank 4 x 10 sec hold     Manual Therapy   Joint Mobilization grade 3 T7-T8 P>A with pt inhaling/ exhaling, 7th/ 8th rib infrerior/ anterior mobs with exhalation    Myofascial Release lumbar paraspinal fascial stretching/ rolling, manual trigger point release with T6-T8 paraspinals                  PT Education - 11/06/15 1119    Education provided Yes   Education Details manual trigger point release over using theracane  or tennis ball to release tightness of the paraspinals, mechanics of ribs with inhalation/ exhalation and where they articulate with the thoracic spine   Person(s) Educated Patient   Methods Explanation;Verbal cues;Demonstration   Comprehension Verbalized understanding;Verbal cues required;Returned demonstration          PT Short Term Goals - 11/06/15 1034      PT SHORT TERM GOAL #1   Title pt wil lbe I with HEP   Time 3   Period Weeks   Status Achieved     PT SHORT TERM GOAL #2   Title Pt will report 25% reduction in thoacolumbar pain with lifting items   Time 3   Period Weeks   Status Achieved     PT SHORT TERM GOAL #3   Title pt will be able to extend shouldes into flexion and behind head without thoracic pain   Time 3   Period Weeks   Status Achieved           PT Long Term Goals - 11/06/15 1036      PT LONG TERM GOAL #1   Title pt will demonstrate improved posture in sitting x 75% of the time without ceuing to peform school work  Period Weeks   Status Achieved     PT LONG TERM GOAL #2   Title pt will be able to breathe 100% of the time without thoracolumbar pain   Baseline 80%   Time 8   Period Weeks   Status On-going     PT LONG TERM GOAL #3   Title pt will report overall thoracolumbar pain </= 2/10 x 2 weeks to perform all functional mobility and school work   Time 8   Period Weeks   Status Achieved               Plan - 11/06/15 1157    Clinical Impression Statement [name] continues to make great progress with physcal therapy meet all goals except for LTG # 2, she reports improved trunk mobility with decreased incidence of tightness or pain. she is able to decreased pain with posture correction which has been continued require intermittent cues. Plan to continue with 1 visit to review HEP and discharge pt.    Rehab Potential Good   PT Treatment/Interventions Electrical Stimulation;Moist Heat;Ultrasound;Functional mobility training;Therapeutic  activities;Therapeutic exercise;Manual techniques;Patient/family education;Dry needling;Taping   PT Next Visit Plan spinal mobs, DN to paraspinals if pt comfortable, manual, core strengthening, progress HEP PRN   PT Home Exercise Plan manual trigger points,    Consulted and Agree with Plan of Care Patient      Patient will benefit from skilled therapeutic intervention in order to improve the following deficits and impairments:  Decreased mobility, Decreased strength, Hypomobility  Visit Diagnosis: Midline low back pain without sciatica - Plan: PT plan of care cert/re-cert  Pain in thoracic spine - Plan: PT plan of care cert/re-cert  Abnormal posture - Plan: PT plan of care cert/re-cert     Problem List There are no active problems to display for this patient.  Lulu RidingKristoffer Jlyn Bracamonte PT, DPT, LAT, ATC  11/06/15  12:16 PM      [facility] [address] [place], [place], [address] Phone: [phone]   Fax:  [phone]  Name: Adria DillRachel L Sherard MRN: [id number] Date of Birth: 10/11/1995

## 2015-11-12 ENCOUNTER — Ambulatory Visit: Payer: Medicaid Other | Attending: Family Medicine | Admitting: Physical Therapy

## 2015-11-12 DIAGNOSIS — M545 Low back pain, unspecified: Secondary | ICD-10-CM

## 2015-11-12 DIAGNOSIS — M546 Pain in thoracic spine: Secondary | ICD-10-CM | POA: Insufficient documentation

## 2015-11-12 DIAGNOSIS — R293 Abnormal posture: Secondary | ICD-10-CM | POA: Insufficient documentation

## 2015-11-12 NOTE — Therapy (Signed)
[place], [place], [address] Phone: [phone]   Fax:  [phone]  Physical Therapy Treatment/ discharge note  Patient Details  Name: Claudia Walters MRN: [id number] Date of Birth: 10-Apr-1995 Referring Provider: Dr. Kelton Pillar  Encounter Date: 11/12/2015      PT End of Session - 11/12/15 1057    Visit Number 10   Number of Visits 16   Date for PT Re-Evaluation 11/20/15   Authorization Type MCD   PT Start Time 1015   PT Stop Time 1057   PT Time Calculation (min) 42 min   Activity Tolerance Patient tolerated treatment well   Behavior During Therapy [facility] for tasks assessed/performed      No past medical history on file.  No past surgical history on file.  There were no vitals filed for this visit.      Subjective Assessment - 11/12/15 1025    Subjective "doing well, the manual trigger point release helped and I can tell I am able to take deep breaths easier"    Currently in Pain? No/denies                         [facility] Adult PT Treatment/Exercise - 11/12/15 0001      Lumbar Exercises: Stretches   Prone Mid Back Stretch 4 reps;30 seconds  holding on to edge of table     Lumbar Exercises: Aerobic   Elliptical L 6 x 10 min  forward 10 min/ backward 10 min                PT Education - 11/12/15 1057    Education provided Yes   Education Details HEP review, how to progress exercise to work toward increased endurance my increasing reps/ sets.    Person(s) Educated Patient   Methods Explanation;Verbal cues   Comprehension Verbalized understanding;Verbal cues required          PT Short Term Goals - 11/06/15 1034      PT SHORT TERM GOAL #1   Title pt wil lbe I with HEP   Time 3   Period Weeks   Status Achieved     PT SHORT TERM GOAL #2   Title Pt will report 25% reduction in thoacolumbar pain with lifting items   Time 3   Period Weeks   Status Achieved      PT SHORT TERM GOAL #3   Title pt will be able to extend shouldes into flexion and behind head without thoracic pain   Time 3   Period Weeks   Status Achieved           PT Long Term Goals - 11/12/15 1059      PT LONG TERM GOAL #1   Title pt will demonstrate improved posture in sitting x 75% of the time without ceuing to peform school work   Time 8   Period Weeks   Status Achieved     PT LONG TERM GOAL #2   Title pt will be able to breathe 100% of the time without thoracolumbar pain   Baseline 90%   Time 8   Period Weeks   Status Partially Met     PT LONG TERM GOAL #3   Title pt will report overall thoracolumbar pain </= 2/10 x 2 weeks to perform all functional mobility and school work   Time 8   Period Weeks   Status Achieved  Plan - 11/12/15 1058    Clinical Impression Statement [name] has made great progress with physical therapy, she reports decreased pain inthe low back/ throacic spine to where it occurs intermittently. She is met or partially met all goals and reports she is able to maintain and progress her current level of function independenlty and will be discharged today.    PT Next Visit Plan discharged from PT   PT Home Exercise Plan HEP review   Consulted and Agree with Plan of Care Patient      Patient will benefit from skilled therapeutic intervention in order to improve the following deficits and impairments:  Decreased mobility, Decreased strength, Hypomobility  Visit Diagnosis: Midline low back pain without sciatica, unspecified chronicity  Pain in thoracic spine  Abnormal posture     Problem List There are no active problems to display for this patient.  Starr Mignogna PT, DPT, LAT, ATC  11/12/15  11:01 AM      [facility] [address] [place], [place], [address] Phone: [phone]   Fax:  [phone]  Name: Claudia Walters MRN: [id number] Date of  Birth: December 19, 1995  PHYSICAL THERAPY DISCHARGE SUMMARY  Visits from Start of Care: 10  Current functional level related to goals / functional outcomes: See goals   Remaining deficits: Intermittent thoracic tightness/ soreness that is reduced with correct posture and stretching.    Education / Equipment: HEP, posture education,   Plan: Patient agrees to discharge.  Patient goals were partially met. Patient is being discharged due to being pleased with the current functional level.  ?????
